# Patient Record
Sex: Female | Born: 2005 | Race: Black or African American | Hispanic: No | Marital: Single | Smoking: Never smoker
Health system: Southern US, Community
[De-identification: ages and names within clinical notes are randomized; demographics above are authoritative.]

## PROBLEM LIST (undated history)

## (undated) HISTORY — PX: LEG SURGERY: SHX1003

---
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
## 2024-02-01 ENCOUNTER — Other Ambulatory Visit: Payer: Self-pay

## 2024-02-01 ENCOUNTER — Ambulatory Visit (HOSPITAL_COMMUNITY)
Admission: EM | Admit: 2024-02-01 | Discharge: 2024-02-01 | Disposition: A | Discharge status: Home / Self Care | Attending: Internal Medicine | Admitting: Internal Medicine

## 2024-02-01 ENCOUNTER — Encounter (HOSPITAL_COMMUNITY): Payer: Self-pay | Admitting: *Deleted

## 2024-02-01 DIAGNOSIS — N760 Acute vaginitis: Secondary | ICD-10-CM | POA: Insufficient documentation

## 2024-02-01 DIAGNOSIS — R21 Rash and other nonspecific skin eruption: Secondary | ICD-10-CM | POA: Insufficient documentation

## 2024-02-01 DIAGNOSIS — N898 Other specified noninflammatory disorders of vagina: Secondary | ICD-10-CM | POA: Insufficient documentation

## 2024-02-01 MED ORDER — VALACYCLOVIR HCL 1 G PO TABS: 1000.0000 mg | ORAL_TABLET | Freq: Two times a day (BID) | ORAL | 0 refills | Status: AC

## 2024-02-01 NOTE — Discharge Instructions (Addendum)
 I suspect that you may have herpes simplex virus to your genital region.  I would like for you to start taking Valtrex 1000 mg every 12 hours to treat suspected herpes virus infection for the next 7 days.   You may use ibuprofen 600 mg every 6 hours as needed for pain and swelling to the vaginal region.  STD testing has been sent and will come back in the next 2 to 3 days.  If your herpes testing is positive, you will need to notify your sexual partner(s).   If you develop any new or worsening symptoms or if your symptoms do not start to improve, please return here or follow-up with your primary care provider. If your symptoms are severe, please go to the emergency room.

## 2024-02-01 NOTE — ED Triage Notes (Signed)
 PT reports she used some soap and about 2 day into using soap she used soap she deloped a rash in private area . Pt reports bumps and cuts in private area . Pt also has a runny vag. Discharge.  PT has a ball that has popped up in bil. Groin area.
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
## 2024-02-02 ENCOUNTER — Ambulatory Visit (HOSPITAL_COMMUNITY): Payer: Self-pay

## 2024-02-02 LAB — CERVICOVAGINAL ANCILLARY ONLY
Bacterial Vaginitis (gardnerella): POSITIVE — AB
Candida Glabrata: NEGATIVE
Candida Vaginitis: POSITIVE — AB
Chlamydia: NEGATIVE
Comment: NEGATIVE
Comment: NEGATIVE
Comment: NEGATIVE
Comment: NEGATIVE
Comment: NEGATIVE
Comment: NORMAL
Neisseria Gonorrhea: NEGATIVE
Trichomonas: NEGATIVE

## 2024-02-02 LAB — HSV 1/2 PCR (SURFACE)
HSV-1 DNA: NOT DETECTED
HSV-2 DNA: DETECTED — AB

## 2024-02-02 MED ORDER — FLUCONAZOLE 150 MG PO TABS: 150.0000 mg | ORAL_TABLET | Freq: Once | ORAL | 0 refills | Status: AC

## 2024-02-02 MED ORDER — METRONIDAZOLE 500 MG PO TABS: 500.0000 mg | ORAL_TABLET | Freq: Two times a day (BID) | ORAL | 0 refills | Status: AC

## 2024-03-07 ENCOUNTER — Ambulatory Visit (HOSPITAL_COMMUNITY)
Admission: EM | Admit: 2024-03-07 | Discharge: 2024-03-07 | Disposition: A | Discharge status: Home / Self Care | Attending: Emergency Medicine | Admitting: Emergency Medicine

## 2024-03-07 ENCOUNTER — Encounter (HOSPITAL_COMMUNITY): Payer: Self-pay

## 2024-03-07 DIAGNOSIS — R051 Acute cough: Secondary | ICD-10-CM

## 2024-03-07 DIAGNOSIS — B349 Viral infection, unspecified: Secondary | ICD-10-CM | POA: Diagnosis not present

## 2024-03-07 DIAGNOSIS — R112 Nausea with vomiting, unspecified: Secondary | ICD-10-CM

## 2024-03-07 LAB — POC COVID19/FLU A&B COMBO
Covid Antigen, POC: NEGATIVE
Influenza A Antigen, POC: NEGATIVE
Influenza B Antigen, POC: NEGATIVE

## 2024-03-07 LAB — POCT URINE PREGNANCY: Preg Test, Ur: NEGATIVE

## 2024-03-07 MED ORDER — BENZONATATE 100 MG PO CAPS: 100.0000 mg | ORAL_CAPSULE | Freq: Three times a day (TID) | ORAL | 0 refills | Status: DC

## 2024-03-07 MED ORDER — AZELASTINE HCL 0.1 % NA SOLN
2.0000 | Freq: Two times a day (BID) | NASAL | 0 refills | Status: AC
Start: 2024-03-07 — End: ?

## 2024-03-07 MED ORDER — PROMETHAZINE-DM 6.25-15 MG/5ML PO SYRP
5.0000 mL | ORAL_SOLUTION | Freq: Every evening | ORAL | 0 refills | Status: AC | PRN
Start: 2024-03-07 — End: ?

## 2024-03-07 NOTE — Discharge Instructions (Signed)
 Your COVID and flu testing were both negative in clinic today.  I believe your symptoms are likely related to a viral illness. You can take Tessalon  every 8 hours as needed for cough. You can use azelastine  nasal spray twice daily for nasal congestion. I also recommend taking over-the-counter Delsym or Robitussin as needed for cough. You can take your previously prescribed promethazine  as needed for nausea and vomiting. Make sure you are staying hydrated and getting plenty of rest. Follow-up with your primary care provider or return here as needed.

## 2024-03-07 NOTE — ED Provider Notes (Addendum)
 [facility]    CSN: [id number] Arrival date & time: 03/07/24  1034      History   Chief Complaint Chief Complaint  Patient presents with   Cough    HPI Brenda Davenport is a 18 y.o. female.   Patient presents with cough, sore throat, congestion, chills, headache, fatigue, and bodyaches that began yesterday.  Patient states that she has had 2 episodes of vomiting that began last night.  Denies chest pain, shortness of breath, diarrhea, abdominal pain, and known fever.  Patient denies taking any medication for her symptoms.  Patient reports that she was around her little sister who is sick with similar symptoms.    Patient reports that she does have some promethazine  at home from when she was previously having nausea and vomiting, but states that she has not yet taken this, but will try this for her nausea and vomiting.  The history is provided by the patient and medical records.  Cough   History reviewed. No pertinent past medical history.  Patient Active Problem List   Diagnosis Date Noted   Family history of malignant neoplasm of breast 08/27/2023    Past Surgical History:  Procedure Laterality Date   LEG SURGERY Left    venous malformation    OB History   No obstetric history on file.      Home Medications    Prior to Admission medications   Medication Sig Start Date End Date Taking? Authorizing Provider  azelastine  (ASTELIN ) 0.1 % nasal spray Place 2 sprays into both nostrils 2 (two) times daily. Use in each nostril as directed 03/07/24  Yes Johnie Flaming A, NP  benzonatate  (TESSALON ) 100 MG capsule Take 1 capsule (100 mg total) by mouth every 8 (eight) hours. 03/07/24  Yes Johnie, Terisha Losasso A, NP  traMADol (ULTRAM) 50 MG tablet Take 50 mg by mouth once.    [provider]    Family History Family History  Problem Relation Age of Onset   Breast cancer Mother 53   Breast cancer Paternal Great-grandmother        unknown age of dx     Social History Social History   Tobacco Use   Smoking status: Never   Smokeless tobacco: Never  Vaping Use   Vaping status: Every Day  Substance Use Topics   Alcohol use: Yes   Drug use: Yes    Types: Marijuana     Allergies   Patient has no known allergies.   Review of Systems Review of Systems  Respiratory:  Positive for cough.    Per HPI  Physical Exam Triage Vital Signs ED Triage Vitals  Encounter Vitals Group     BP 03/07/24 1104 110/62     Girls Systolic BP Percentile --      Girls Diastolic BP Percentile --      Boys Systolic BP Percentile --      Boys Diastolic BP Percentile --      Pulse Rate 03/07/24 1104 98     Resp 03/07/24 1104 16     Temp 03/07/24 1104 99.5 F (37.5 C)     Temp Source 03/07/24 1104 Oral     SpO2 03/07/24 1104 100 %     Weight --      Height --      Head Circumference --      Peak Flow --      Pain Score 03/07/24 1102 7     Pain Loc --  Pain Education --      Exclude from Growth Chart --    No data found.  Updated Vital Signs BP 110/62 (BP Location: Left Arm)   Pulse 98   Temp 99.5 F (37.5 C) (Oral)   Resp 16   LMP 01/21/2024   SpO2 100%   Visual Acuity Right Eye Distance:   Left Eye Distance:   Bilateral Distance:    Right Eye Near:   Left Eye Near:    Bilateral Near:     Physical Exam Vitals and nursing note reviewed.  Constitutional:      General: She is awake. She is not in acute distress.    Appearance: Normal appearance. She is well-developed and well-groomed. She is not ill-appearing.  HENT:     Right Ear: Tympanic membrane, ear canal and external ear normal.     Left Ear: Tympanic membrane, ear canal and external ear normal.     Nose: Congestion and rhinorrhea present.     Mouth/Throat:     Mouth: Mucous membranes are moist.     Pharynx: Posterior oropharyngeal erythema and postnasal drip present. No oropharyngeal exudate.  Cardiovascular:     Rate and Rhythm: Normal rate and regular  rhythm.  Pulmonary:     Effort: Pulmonary effort is normal.     Breath sounds: Normal breath sounds.  Abdominal:     General: Abdomen is flat. Bowel sounds are normal. There is no distension.     Palpations: Abdomen is soft. There is no mass.     Tenderness: There is no abdominal tenderness. There is no guarding or rebound.     Hernia: No hernia is present.  Skin:    General: Skin is warm and dry.  Neurological:     General: No focal deficit present.     Mental Status: She is alert and oriented to person, place, and time. Mental status is at baseline.  Psychiatric:        Behavior: Behavior is cooperative.      UC Treatments / Results  Labs (all labs ordered are listed, but only abnormal results are displayed) Labs Reviewed  POC COVID19/FLU A&B COMBO  POCT URINE PREGNANCY    EKG   Radiology No results found.  Procedures Procedures (including critical care time)  Medications Ordered in UC Medications - No data to display  Initial Impression / Assessment and Plan / UC Course  I have reviewed the triage vital signs and the nursing notes.  Pertinent labs & imaging results that were available during my care of the patient were reviewed by me and considered in my medical decision making (see chart for details).     Patient is overall well-appearing.  Vitals are stable.  COVID and flu testing negative in clinic today.  Symptoms likely viral in nature.  Pregnancy test performed due to late menstrual cycle.  This was negative.  Prescribed Tessalon  as needed for cough.  Prescribed azelastine  for nasal congestion.  Recommended taking previously prescribed promethazine  as needed for nausea and vomiting.  Discussed over-the-counter medications for symptoms.  Discussed follow-up and return precautions.  Patient reported on discharge that she would prefer to have Promethazine  DM cough syrup sent in to use for her cough at night versus taking her previously prescribed promethazine   for nausea at home.  Prescription for Promethazine  DM cough syrup sent in. Final Clinical Impressions(s) / UC Diagnoses   Final diagnoses:  Acute cough  Viral illness  Nausea and vomiting, unspecified vomiting type  Discharge Instructions      Your COVID and flu testing were both negative in clinic today.  I believe your symptoms are likely related to a viral illness. You can take Tessalon  every 8 hours as needed for cough. You can use azelastine  nasal spray twice daily for nasal congestion. I also recommend taking over-the-counter Delsym or Robitussin as needed for cough. You can take your previously prescribed promethazine  as needed for nausea and vomiting. Make sure you are staying hydrated and getting plenty of rest. Follow-up with your primary care provider or return here as needed.     ED Prescriptions     Medication Sig Dispense Auth. Provider   benzonatate  (TESSALON ) 100 MG capsule Take 1 capsule (100 mg total) by mouth every 8 (eight) hours. 21 capsule Johnie, Pieper Kasik A, NP   azelastine  (ASTELIN ) 0.1 % nasal spray Place 2 sprays into both nostrils 2 (two) times daily. Use in each nostril as directed 30 mL Johnie Flaming A, NP      PDMP not reviewed this encounter.   Johnie Flaming LABOR, NP 03/07/24 1157    Johnie Flaming A, NP 03/07/24 1217

## 2024-03-07 NOTE — ED Triage Notes (Signed)
 Pt c/o cough, sore throat, chest/nasal congestion, chills, headache, fatigue, and body aches since yesterday. States vomit x1 last night and once today after drinking water. Denies taken any meds.

## 2024-03-10 ENCOUNTER — Ambulatory Visit

## 2024-03-29 ENCOUNTER — Encounter (HOSPITAL_COMMUNITY): Payer: Self-pay | Admitting: Emergency Medicine

## 2024-03-29 ENCOUNTER — Ambulatory Visit (HOSPITAL_COMMUNITY)
Admission: EM | Admit: 2024-03-29 | Discharge: 2024-03-29 | Disposition: A | Discharge status: Home / Self Care | Attending: Family Medicine | Admitting: Family Medicine

## 2024-03-29 DIAGNOSIS — L309 Dermatitis, unspecified: Secondary | ICD-10-CM | POA: Insufficient documentation

## 2024-03-29 DIAGNOSIS — N898 Other specified noninflammatory disorders of vagina: Secondary | ICD-10-CM | POA: Diagnosis present

## 2024-03-29 MED ORDER — METRONIDAZOLE 500 MG PO TABS: 500.0000 mg | ORAL_TABLET | Freq: Two times a day (BID) | ORAL | 0 refills | Status: AC

## 2024-03-29 NOTE — ED Triage Notes (Signed)
 Pt reports having vaginal discharge for some days that is runny and has an odor.

## 2024-03-29 NOTE — ED Provider Notes (Signed)
 [facility]   [id number] 03/29/24 Arrival Time: 1517  ASSESSMENT & PLAN:  1. Vaginal discharge   2. Eczema, unspecified type    Will tx empirically for BV. Requests cream for seasonal eczema on hands. No signs of skin infection.  Meds ordered this encounter  Medications   metroNIDAZOLE  (FLAGYL ) 500 MG tablet    Sig: Take 1 tablet (500 mg total) by mouth 2 (two) times daily.    Dispense:  14 tablet    Refill:  0   triamcinolone  0.1%-Cetaphil equivalent 3:1 cream mixture    Sig: Apply topically 2 (two) times daily.    Dispense:  454 g    Refill:  2       Discharge Instructions      We have sent testing for sexually transmitted infections. We will notify you of any positive results once they are received. If required, we will prescribe any medications you might need.  Please refrain from all sexual activity for at least the next seven days.     Without s/s of PID.  Labs Reviewed  CERVICOVAGINAL ANCILLARY ONLY    Will notify of any positive results. Instructed to refrain from sexual activity for at least seven days.  Reviewed expectations re: course of current medical issues. Questions answered. Outlined signs and symptoms indicating need for more acute intervention. Patient verbalized understanding. After Visit Summary given.   SUBJECTIVE:  Brenda Davenport is a 18 y.o. female who presents with complaint of vaginal discharge. Few days. Denies abd/pelvic pain. Requests STI testing. Also needs Rx eczema cream; recent flare on hands.  Patient's last menstrual period was 01/21/2024.   OBJECTIVE:  Vitals:   03/29/24 1528  BP: (!) 94/53  Pulse: 68  Resp: 14  Temp: 98.3 F (36.8 C)  TempSrc: Oral  SpO2: 99%     General appearance: alert, cooperative, appears stated age and no distress Lungs: unlabored respirations; speaks full sentences without difficulty Back: no CVA tenderness; FROM at waist Abdomen: soft, non-tender GU: deferred Skin:  warm and dry; eczematous changes to bilateral dorsal hands Psychological: alert and cooperative; normal mood and affect.    Labs Reviewed  CERVICOVAGINAL ANCILLARY ONLY    Allergies[1]  History reviewed. No pertinent past medical history. Family History  Problem Relation Age of Onset   Breast cancer Mother 49   Breast cancer Paternal Great-grandmother        unknown age of dx   Social History   Socioeconomic History   Marital status: Single    Spouse name: Not on file   Number of children: Not on file   Years of education: Not on file   Highest education level: Not on file  Occupational History   Not on file  Tobacco Use   Smoking status: Never   Smokeless tobacco: Never  Vaping Use   Vaping status: Every Day  Substance and Sexual Activity   Alcohol use: Yes   Drug use: Yes    Types: Marijuana   Sexual activity: Not on file  Other Topics Concern   Not on file  Social History Narrative   Not on file   Social Drivers of Health   Tobacco Use: Low Risk (03/07/2024)   Patient History    Smoking Tobacco Use: Never    Smokeless Tobacco Use: Never    Passive Exposure: Not on file  Financial Resource Strain: Not on file  Food Insecurity: Not on file  Transportation Needs: Not on file  Physical Activity: Not on file  Stress: Not on file  Social Connections: Not on file  Intimate Partner Violence: Not on file  Depression (EYV7-0): Not on file  Alcohol Screen: Not on file  Housing: Not on file  Utilities: Not on file  Health Literacy: Not on file            [1] No Known Allergies    Rolinda Rogue, MD 03/29/24 1740

## 2024-03-29 NOTE — Discharge Instructions (Addendum)
 We have sent testing for sexually transmitted infections. We will notify you of any positive results once they are received. If required, we will prescribe any medications you might need.  Please refrain from all sexual activity for at least the next seven days.

## 2024-03-30 ENCOUNTER — Ambulatory Visit (HOSPITAL_COMMUNITY): Payer: Self-pay

## 2024-03-30 LAB — CERVICOVAGINAL ANCILLARY ONLY
Bacterial Vaginitis (gardnerella): POSITIVE — AB
Candida Glabrata: NEGATIVE
Candida Vaginitis: POSITIVE — AB
Chlamydia: NEGATIVE
Comment: NEGATIVE
Comment: NEGATIVE
Comment: NEGATIVE
Comment: NEGATIVE
Comment: NEGATIVE
Comment: NORMAL
Neisseria Gonorrhea: NEGATIVE
Trichomonas: NEGATIVE

## 2024-03-30 MED ORDER — FLUCONAZOLE 150 MG PO TABS: 150.0000 mg | ORAL_TABLET | Freq: Once | ORAL | 0 refills | Status: AC
